# Patient Record
Sex: Male | Born: 1995 | Race: White | Hispanic: No | Marital: Single | Smoking: Never smoker
Health system: Southern US, Community
[De-identification: ages and names within clinical notes are randomized; demographics above are authoritative.]

## PROBLEM LIST (undated history)

## (undated) DIAGNOSIS — R51 Headache: Secondary | ICD-10-CM

## (undated) DIAGNOSIS — R569 Unspecified convulsions: Principal | ICD-10-CM

## (undated) HISTORY — DX: Unspecified convulsions: R56.9

## (undated) HISTORY — PX: WISDOM TOOTH EXTRACTION: SHX21

---
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
## 2014-05-04 ENCOUNTER — Telehealth: Payer: Self-pay | Admitting: Neurology

## 2014-05-04 DIAGNOSIS — R569 Unspecified convulsions: Secondary | ICD-10-CM

## 2014-05-04 NOTE — Telephone Encounter (Signed)
See phone note today.

## 2014-05-04 NOTE — Telephone Encounter (Signed)
I called the mother. The patient has had a single seizure event, lasting about a year ago. The patient did have MRI evaluation the brain, but he never came in for the EEG study. We will need to get EEG, and a revisit. If the EEG is normal, the patient may return to driving.  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

## 2014-05-04 NOTE — Telephone Encounter (Signed)
Patient's mother Bonita Quin calling to ask whether the doctor needs to do anything to lift patient's driving restrictions since it has already been 6 months, or if it will drop off by itself. Patient is trying to get his license. Patient's mother Bonita Quin is not in patient contacts. Please return call and advise.

## 2014-05-05 ENCOUNTER — Telehealth: Payer: Self-pay | Admitting: *Deleted

## 2014-05-05 NOTE — Telephone Encounter (Signed)
I called patient and scheduled EEG for 05/12/14. He will check with his mother to see if this appointment is ok. If not, patient will call back to reschedule. He also needs a FOLLOW REVISIT AFTER EEG.

## 2014-05-08 ENCOUNTER — Encounter: Payer: Self-pay | Admitting: Neurology

## 2014-05-08 ENCOUNTER — Ambulatory Visit (INDEPENDENT_AMBULATORY_CARE_PROVIDER_SITE_OTHER): Payer: Self-pay | Admitting: Neurology

## 2014-05-08 ENCOUNTER — Other Ambulatory Visit (INDEPENDENT_AMBULATORY_CARE_PROVIDER_SITE_OTHER): Payer: Self-pay

## 2014-05-08 ENCOUNTER — Telehealth: Payer: Self-pay | Admitting: *Deleted

## 2014-05-08 ENCOUNTER — Encounter (INDEPENDENT_AMBULATORY_CARE_PROVIDER_SITE_OTHER): Payer: Self-pay

## 2014-05-08 VITALS — BP 108/80 | HR 62 | Ht 67.0 in | Wt 163.0 lb

## 2014-05-08 DIAGNOSIS — R569 Unspecified convulsions: Secondary | ICD-10-CM

## 2014-05-08 DIAGNOSIS — Z0289 Encounter for other administrative examinations: Secondary | ICD-10-CM

## 2014-05-08 DIAGNOSIS — G40309 Generalized idiopathic epilepsy and epileptic syndromes, not intractable, without status epilepticus: Secondary | ICD-10-CM

## 2014-05-08 MED ORDER — LEVETIRACETAM 500 MG PO TABS: 500.0000 mg | ORAL_TABLET | Freq: Two times a day (BID) | ORAL | Status: AC

## 2014-05-08 NOTE — Procedures (Signed)
      Ronald Trevino is a 18 year old patient with a history of a single seizure event that occurred around 04/18/2013. The patient never followed up for MRI evaluation or for EEG evaluation, but he has not driven since the seizure. He comes in finally for evaluation for the seizure.  This is a routine EEG. No skull defect is noted. The patient is on no medications.  EEG classification: Dysrhythmia grade 2 left hemisphere  Description of the recording: The background rhythms of this recording consists of a relatively well modulated medium amplitude alpha rhythm of 10 Hz that is reactive to eye opening and closure. Photic stimulation was not performed, but hyperventilation was performed, and this results in a minimal buildup of the background rhythm activities without significant slowing seen. Intermittently during the recording, dysrhythmic theta activity is seen emanating from the left hemisphere. Occasionally, generalized dysrhythmic activity is seen, with bifrontal sharp and slow-wave activity. No electrographic seizures were recorded. Cardiac monitor shows a heart rate of 66 without evidence of cardiac arrhythmias.  Impression: This is an abnormal EEG recording secondary to dysrhythmic activity emanating from the left hemisphere suggesting a lowered seizure threshold with a left brain abnormality. No electrographic seizures were recorded.

## 2014-05-08 NOTE — Progress Notes (Signed)
Reason for visit: Seizure  Ronald Trevino is an 18 y.o. male  History of present illness:  Ronald Trevino is a 18 year old right-handed white male with a history of a single seizure event that occurred in the beginning of September 2014. The patient had been taking Ultram around that time. The patient was set up for MRI evaluation and an EEG study. He has refrained from driving. He did not get the MRI of the brain until December of 2014, and he just now got the EEG study today. The MRI of the brain was relatively unremarkable with exception of Arnold-Chiari malformation, and an EEG study is abnormal with dysrhythmic activity emanating from the left hemisphere, with occasional bursts of generalized slowing and bifrontal sharp and slow-wave activity. The patient is felt to have epilepsy, although he has gone an entire year without any recurrent seizures. The patient indicates that he has not had episodes of "lost time", he has not had any tongue biting or nocturia. The patient returns for an evaluation.  Past Medical History  Diagnosis Date  . Seizures   . Convulsion 05/12/2013  . [id number](657.8)     Past Surgical History  Procedure Laterality Date  . Wisdom tooth extraction    . Maxillary le forte i osteotomy Bilateral 11/08/2013    Procedure: MAXILLARY LEFORTE I;  Surgeon: Georgia Lopes, DDS;  Location: [facility] OR;  Service: Oral Surgery;  Laterality: Bilateral;  . Mandible osteotomy Bilateral 11/08/2013    Procedure: BILATERAL MANDIBULAR OSTEOTOMY/SAGGITAL SPLIT OSTEOTOMY WITH BONE GRAFT;  Surgeon: Georgia Lopes, DDS;  Location: [facility] OR;  Service: Oral Surgery;  Laterality: Bilateral;    Family History  Problem Relation Age of Onset  . Diabetes Other   . Cancer - Lung Other     Social history:  reports that he has never smoked. He has never used smokeless tobacco. He reports that he uses illicit drugs (Marijuana). He reports that he does not drink alcohol.    Allergies  Allergen Reactions    . Tramadol     UNKNOWN    Medications:  No current outpatient prescriptions on file prior to visit.   No current facility-administered medications on file prior to visit.    ROS:  Out of a complete 14 system review of symptoms, the patient complains only of the following symptoms, and all other reviewed systems are negative.  Seizure event  Blood pressure 108/80, pulse 62, height  (1.702 m), weight 163 lb (73.936 kg).  Physical Exam  General: The patient is alert and cooperative at the time of the examination.  Skin: No significant peripheral edema is noted.   Neurologic Exam  Mental status: The patient is oriented x 3.  Cranial nerves: Facial symmetry is present. Speech is normal, no aphasia or dysarthria is noted. Extraocular movements are full. Visual fields are full.  Motor: The patient has good strength in all 4 extremities.  Sensory examination: Soft touch sensation is symmetric on the face, arms, and legs.  Coordination: The patient has good finger-nose-finger and heel-to-shin bilaterally.  Gait and station: The patient has a normal gait. Tandem gait is normal. Romberg is negative. No drift is seen.  Reflexes: Deep tendon reflexes are symmetric.    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 History of seizure event  The patient had a single seizure event approximately one  year ago, but the EEG study is clearly abnormal with a left brain abnormality. The patient will be placed on Keppra at this point. He has already gone over a year without driving, it is probably okay for him to get back to driving at this time. The patient will start on Keppra taking 250 mg twice daily for 2 weeks, and then go to 500 mg twice daily. He will contact our office if there are tolerance issues. Otherwise, he will followup in 6  months.  Marlan Palau MD 05/08/2014 12:15 PM  [facility] [address] [place], [place] [address]  Phone [phone] Fax [phone]

## 2014-05-08 NOTE — Telephone Encounter (Signed)
I will write a letter for the [facility].

## 2014-05-08 NOTE — Patient Instructions (Addendum)
With the Keppra (levacitram) 500 mg tablet, start taking 1/2 tablet twice a day for 2 weeks, then take one tablet twice a day.  Epilepsy Epilepsy is a disorder in which a person has repeated seizures over time. A seizure is a release of abnormal electrical activity in the brain. Seizures can cause a change in attention, behavior, or the ability to remain awake and alert (altered mental status). Seizures often involve uncontrollable shaking (convulsions).  Most people with epilepsy lead normal lives. However, people with epilepsy are at an increased risk of falls, accidents, and injuries. Therefore, it is important to begin treatment right away. CAUSES  Epilepsy has many possible causes. Anything that disturbs the normal pattern of brain cell activity can lead to seizures. This may include:   Head injury.  Birth trauma.  High fever as a child.  Stroke.  Bleeding into or around the brain.  Certain drugs.  Prolonged low oxygen, such as what occurs after CPR efforts.  Abnormal brain development.  Certain illnesses, such as meningitis, encephalitis (brain infection), malaria, and other infections.  An imbalance of nerve signaling chemicals (neurotransmitters).  SIGNS AND SYMPTOMS  The symptoms of a seizure can vary greatly from one person to another. Right before a seizure, you may have a warning (aura) that a seizure is about to occur. An aura may include the following symptoms:  Fear or anxiety.  Nausea.  Feeling like the room is spinning (vertigo).  Vision changes, such as seeing flashing lights or spots. Common symptoms during a seizure include:  Abnormal sensations, such as an abnormal smell or a bitter taste in the mouth.   Sudden, general body stiffness.   Convulsions that involve rhythmic jerking of the face, arm, or leg on one or both sides.   Sudden change in consciousness.   Appearing to be awake but not responding.   Appearing to be asleep but cannot be  awakened.   Grimacing, chewing, lip smacking, drooling, tongue biting, or loss of bowel or bladder control. After a seizure, you may feel sleepy for a while. DIAGNOSIS  Your health care provider will ask about your symptoms and take a medical history. Descriptions from any witnesses to your seizures will be very helpful in the diagnosis. A physical exam, including a detailed neurological exam, is necessary. Various tests may be done, such as:   An electroencephalogram (EEG). This is a painless test of your brain waves. In this test, a diagram is created of your brain waves. These diagrams can be interpreted by a specialist.  An MRI of the brain.   A CT scan of the brain.   A spinal tap (lumbar puncture, LP).  Blood tests to check for signs of infection or abnormal blood chemistry. TREATMENT  There is no cure for epilepsy, but it is generally treatable. Once epilepsy is diagnosed, it is important to begin treatment as soon as possible. For most people with epilepsy, seizures can be controlled with medicines. The following may also be used:  A pacemaker for the brain (vagus nerve stimulator) can be used for people with seizures that are not well controlled by medicine.  Surgery on the brain. For some people, epilepsy eventually goes away. HOME CARE INSTRUCTIONS   Follow your health care provider's recommendations on driving and safety in normal activities.  Get enough rest. Lack of sleep can cause seizures.  Only take over-the-counter or prescription medicines as directed by your health care provider. Take any prescribed medicine exactly as directed.  Avoid  any known triggers of your seizures.  Keep a seizure diary. Record what you recall about any seizure, especially any possible trigger.   Make sure the people you live and work with know that you are prone to seizures. They should receive instructions on how to help you. In general, a witness to a seizure should:   Cushion  your head and body.   Turn you on your side.   Avoid unnecessarily restraining you.   Not place anything inside your mouth.   Call for emergency medical help if there is any question about what has occurred.   Follow up with your health care provider as directed. You may need regular blood tests to monitor the levels of your medicine.  SEEK MEDICAL CARE IF:   You develop signs of infection or other illness. This might increase the risk of a seizure.   You seem to be having more frequent seizures.   Your seizure pattern is changing.  SEEK IMMEDIATE MEDICAL CARE IF:   You have a seizure that does not stop after a few moments.   You have a seizure that causes any difficulty in breathing.   You have a seizure that results in a very severe headache.   You have a seizure that leaves you with the inability to speak or use a part of your body.  Document Released: 08/04/2005 Document Revised: 05/25/2013 Document Reviewed: 03/16/2013 [facility] Patient Information 2015 [place], [place]. This information is not intended to replace advice given to you by your health care provider. Make sure you discuss any questions you have with your health care provider.

## 2014-05-08 NOTE — Telephone Encounter (Signed)
Please write letter for dmv stating driving restrictions.

## 2014-05-09 ENCOUNTER — Telehealth: Payer: Self-pay | Admitting: *Deleted

## 2014-05-09 NOTE — Telephone Encounter (Signed)
Informed patient father that a letter has been written for [facility]. He will come by the office to pick-up.

## 2014-05-11 ENCOUNTER — Telehealth: Payer: Self-pay | Admitting: Neurology

## 2014-05-11 NOTE — Telephone Encounter (Signed)
Spoke with patient's mother who will drop off dmv paperwork for completion. She was made aware of $25 fee. She says that there is a correction that needs to be made as far as EEG completion. EEG testing was done on 05/10/14.

## 2014-05-11 NOTE — Telephone Encounter (Signed)
Patient's mother calling to state that the letter that was written for the [facility] was not enough for patient's driving restrictions to be lifted, please return call and advise.

## 2014-05-12 ENCOUNTER — Telehealth: Payer: Self-pay | Admitting: *Deleted

## 2014-05-12 ENCOUNTER — Other Ambulatory Visit: Payer: Self-pay

## 2014-05-12 NOTE — Telephone Encounter (Signed)
Form DMV to nurse 05-12-14 to be completed.

## 2014-05-16 DIAGNOSIS — Z0289 Encounter for other administrative examinations: Secondary | ICD-10-CM

## 2014-05-22 NOTE — Telephone Encounter (Signed)
I received Forms from Medical Records and I have placed them in the Providers file and have placed them on his assistants In-Box to be completed. 

## 2014-05-23 ENCOUNTER — Telehealth: Payer: Self-pay | Admitting: Neurology

## 2014-05-23 NOTE — Telephone Encounter (Addendum)
The forms [facility] confirmation received.  Also spoke to patient and relayed forms were completed [facility]

## 2014-05-23 NOTE — Telephone Encounter (Signed)
Patient's father checking status of DMV paperwork.  Please call and advise.

## 2014-05-23 NOTE — Telephone Encounter (Signed)
These forms were completed on 05-17-14, but somehow ended back in a folder to be completed.

## 2014-05-23 NOTE — Telephone Encounter (Signed)
I called patient. The DMV forms were received, probably around 05/12/2014. I am not sure who has the forms currently but they need to be filled out ASAP.

## 2014-05-23 NOTE — Telephone Encounter (Signed)
Patient calling checking on his DMV forms, upset stating that it should not take this long to complete his forms.

## 2014-05-29 NOTE — Telephone Encounter (Signed)
Form was checked off and taken to MR

## 2014-11-06 ENCOUNTER — Ambulatory Visit: Payer: Self-pay | Admitting: Adult Health

## 2017-09-30 ENCOUNTER — Encounter (HOSPITAL_COMMUNITY): Payer: Self-pay | Admitting: Emergency Medicine

## 2017-09-30 ENCOUNTER — Observation Stay (HOSPITAL_COMMUNITY)
Admission: EM | Admit: 2017-09-30 | Discharge: 2017-10-02 | Disposition: A | Discharge status: Home / Self Care | Payer: PRIVATE HEALTH INSURANCE | Attending: General Surgery | Admitting: General Surgery

## 2017-09-30 ENCOUNTER — Other Ambulatory Visit: Payer: Self-pay

## 2017-09-30 ENCOUNTER — Emergency Department (HOSPITAL_COMMUNITY): Payer: PRIVATE HEALTH INSURANCE

## 2017-09-30 DIAGNOSIS — S41112A Laceration without foreign body of left upper arm, initial encounter: Secondary | ICD-10-CM | POA: Insufficient documentation

## 2017-09-30 DIAGNOSIS — S32039A Unspecified fracture of third lumbar vertebra, initial encounter for closed fracture: Secondary | ICD-10-CM | POA: Diagnosis not present

## 2017-09-30 DIAGNOSIS — T1490XA Injury, unspecified, initial encounter: Secondary | ICD-10-CM | POA: Diagnosis present

## 2017-09-30 DIAGNOSIS — M79672 Pain in left foot: Secondary | ICD-10-CM

## 2017-09-30 DIAGNOSIS — S060X0A Concussion without loss of consciousness, initial encounter: Principal | ICD-10-CM | POA: Insufficient documentation

## 2017-09-30 DIAGNOSIS — S32019A Unspecified fracture of first lumbar vertebra, initial encounter for closed fracture: Secondary | ICD-10-CM | POA: Diagnosis not present

## 2017-09-30 DIAGNOSIS — S27339A Laceration of lung, unspecified, initial encounter: Secondary | ICD-10-CM | POA: Diagnosis present

## 2017-09-30 DIAGNOSIS — Y92488 Other paved roadways as the place of occurrence of the external cause: Secondary | ICD-10-CM | POA: Diagnosis not present

## 2017-09-30 DIAGNOSIS — S32029A Unspecified fracture of second lumbar vertebra, initial encounter for closed fracture: Secondary | ICD-10-CM | POA: Diagnosis not present

## 2017-09-30 DIAGNOSIS — S32009A Unspecified fracture of unspecified lumbar vertebra, initial encounter for closed fracture: Secondary | ICD-10-CM | POA: Diagnosis present

## 2017-09-30 LAB — COMPREHENSIVE METABOLIC PANEL
ALT: 41 U/L (ref 17–63)
AST: 69 U/L — ABNORMAL HIGH (ref 15–41)
Albumin: 4.2 g/dL (ref 3.5–5.0)
Alkaline Phosphatase: 58 U/L (ref 38–126)
Anion gap: 11 (ref 5–15)
BUN: 8 mg/dL (ref 6–20)
CHLORIDE: 102 mmol/L (ref 101–111)
CO2: 23 mmol/L (ref 22–32)
CREATININE: 1.05 mg/dL (ref 0.61–1.24)
Calcium: 9.2 mg/dL (ref 8.9–10.3)
GFR calc non Af Amer: >60 mL/min (ref >60)
Glucose, Bld: 111 mg/dL — ABNORMAL HIGH (ref 65–99)
Potassium: 3.6 mmol/L (ref 3.5–5.1)
Sodium: 136 mmol/L (ref 135–145)
Total Bilirubin: 0.8 mg/dL (ref 0.3–1.2)
Total Protein: 6.7 g/dL (ref 6.5–8.1)

## 2017-09-30 LAB — CBC
HCT: 43.2 % (ref 39.0–52.0)
Hemoglobin: 14.5 g/dL (ref 13.0–17.0)
MCH: 28.2 pg (ref 26.0–34.0)
MCHC: 33.6 g/dL (ref 30.0–36.0)
MCV: 84 fL (ref 78.0–100.0)
Platelets: 269 10*3/uL (ref 150–400)
RBC: 5.14 MIL/uL (ref 4.22–5.81)
RDW: 12.2 % (ref 11.5–15.5)
WBC: 27.3 10*3/uL — ABNORMAL HIGH (ref 4.0–10.5)

## 2017-09-30 MED ORDER — METOPROLOL TARTRATE 5 MG/5ML IV SOLN: 5.0000 mg | Freq: Four times a day (QID) | INTRAVENOUS | Status: DC | PRN

## 2017-09-30 MED ORDER — ACETAMINOPHEN 325 MG PO TABS
650.0000 mg | ORAL_TABLET | ORAL | Status: DC | PRN
  Administered 2017-10-01: 650 mg via ORAL
  Filled 2017-09-30: qty 2

## 2017-09-30 MED ORDER — BISACODYL 10 MG RE SUPP: 10.0000 mg | Freq: Every day | RECTAL | Status: DC | PRN

## 2017-09-30 MED ORDER — HYDROMORPHONE HCL 1 MG/ML IJ SOLN
1.0000 mg | INTRAMUSCULAR | Status: DC | PRN
  Administered 2017-09-30: 1 mg via INTRAVENOUS
  Filled 2017-09-30 (×2): qty 1

## 2017-09-30 MED ORDER — ONDANSETRON HCL 4 MG/2ML IJ SOLN
4.0000 mg | Freq: Four times a day (QID) | INTRAMUSCULAR | Status: DC | PRN
  Administered 2017-10-01 – 2017-10-02 (×2): 4 mg via INTRAVENOUS
  Filled 2017-09-30 (×2): qty 2

## 2017-09-30 MED ORDER — ENOXAPARIN SODIUM 40 MG/0.4ML ~~LOC~~ SOLN
40.0000 mg | Freq: Every day | SUBCUTANEOUS | Status: DC
  Administered 2017-10-01: 40 mg via SUBCUTANEOUS
  Filled 2017-09-30: qty 0.4

## 2017-09-30 MED ORDER — HYDROMORPHONE HCL 1 MG/ML IJ SOLN
0.5000 mg | INTRAMUSCULAR | Status: AC | PRN
  Administered 2017-10-01: 1 mg via INTRAVENOUS
  Filled 2017-09-30: qty 2
  Filled 2017-09-30: qty 1

## 2017-09-30 MED ORDER — IOPAMIDOL (ISOVUE-300) INJECTION 61%
INTRAVENOUS | Status: AC
  Administered 2017-09-30: 100 mL
  Filled 2017-09-30: qty 100

## 2017-09-30 MED ORDER — OXYCODONE HCL 5 MG PO TABS: 5.0000 mg | ORAL_TABLET | ORAL | Status: DC | PRN

## 2017-09-30 MED ORDER — DOUBLE ANTIBIOTIC 500-10000 UNIT/GM EX OINT
TOPICAL_OINTMENT | Freq: Two times a day (BID) | CUTANEOUS | Status: DC
  Administered 2017-10-01 – 2017-10-02 (×4): via TOPICAL
  Filled 2017-09-30 (×2): qty 28.4

## 2017-09-30 MED ORDER — OXYCODONE HCL 5 MG PO TABS
10.0000 mg | ORAL_TABLET | ORAL | Status: DC | PRN
  Administered 2017-10-01 – 2017-10-02 (×8): 10 mg via ORAL
  Filled 2017-09-30 (×8): qty 2

## 2017-09-30 MED ORDER — DOCUSATE SODIUM 100 MG PO CAPS
100.0000 mg | ORAL_CAPSULE | Freq: Two times a day (BID) | ORAL | Status: DC
  Administered 2017-10-01 – 2017-10-02 (×3): 100 mg via ORAL
  Filled 2017-09-30 (×3): qty 1

## 2017-09-30 MED ORDER — ONDANSETRON 4 MG PO TBDP: 4.0000 mg | ORAL_TABLET | Freq: Four times a day (QID) | ORAL | Status: DC | PRN
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
## 2017-09-30 NOTE — ED Notes (Signed)
Dr Donell Beersbyerly here to see the pt

## 2017-09-30 NOTE — ED Notes (Signed)
Pt whining  He wants all the stuff off him  He wants something to eat and drink

## 2017-09-30 NOTE — ED Notes (Signed)
Report called to rn nellie on 6n
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
## 2017-09-30 NOTE — ED Notes (Signed)
Just woke up from sleeping from getting pain med  Moaning now with both eyes closed.  Wanting to walk in the room
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
## 2017-10-01 ENCOUNTER — Observation Stay (HOSPITAL_COMMUNITY): Payer: PRIVATE HEALTH INSURANCE

## 2017-10-01 LAB — BASIC METABOLIC PANEL
ANION GAP: 12 (ref 5–15)
BUN: 9 mg/dL (ref 6–20)
CALCIUM: 9 mg/dL (ref 8.9–10.3)
CO2: 23 mmol/L (ref 22–32)
CREATININE: 0.91 mg/dL (ref 0.61–1.24)
Chloride: 102 mmol/L (ref 101–111)
GFR calc Af Amer: >60 mL/min (ref >60)
GFR calc non Af Amer: >60 mL/min (ref >60)
GLUCOSE: 113 mg/dL — AB (ref 65–99)
Potassium: 3.5 mmol/L (ref 3.5–5.1)
Sodium: 137 mmol/L (ref 135–145)

## 2017-10-01 LAB — CBC
HEMATOCRIT: 42.4 % (ref 39.0–52.0)
Hemoglobin: 14 g/dL (ref 13.0–17.0)
MCH: 28 pg (ref 26.0–34.0)
MCHC: 33 g/dL (ref 30.0–36.0)
MCV: 84.8 fL (ref 78.0–100.0)
PLATELETS: 236 10*3/uL (ref 150–400)
RBC: 5 MIL/uL (ref 4.22–5.81)
RDW: 12.5 % (ref 11.5–15.5)
WBC: 11 10*3/uL — ABNORMAL HIGH (ref 4.0–10.5)

## 2017-10-01 LAB — HIV ANTIBODY (ROUTINE TESTING W REFLEX): HIV Screen 4th Generation wRfx: NONREACTIVE

## 2017-10-01 MED ORDER — ACETAMINOPHEN 500 MG PO TABS
1000.0000 mg | ORAL_TABLET | Freq: Three times a day (TID) | ORAL | Status: DC
  Administered 2017-10-01 – 2017-10-02 (×3): 1000 mg via ORAL
  Filled 2017-10-01 (×3): qty 2

## 2017-10-01 MED ORDER — METHOCARBAMOL 750 MG PO TABS
750.0000 mg | ORAL_TABLET | Freq: Three times a day (TID) | ORAL | Status: DC
  Administered 2017-10-01 – 2017-10-02 (×3): 750 mg via ORAL
  Filled 2017-10-01 (×3): qty 1

## 2017-10-01 MED ORDER — POLYETHYLENE GLYCOL 3350 17 G PO PACK
17.0000 g | PACK | Freq: Every day | ORAL | Status: DC
  Administered 2017-10-02: 17 g via ORAL
  Filled 2017-10-01: qty 1
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
## 2017-10-01 NOTE — Progress Notes (Signed)
Received pt per stretcher. Pt is alert and oriented x4. Pt grimaces with back pain upon movement. Denies SOB. Will monitor pt.
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
## 2017-10-01 NOTE — Progress Notes (Signed)
Pt with nausea and vomitted small clear emesis. Zofran given. Vitals within normal limits.
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
## 2017-10-02 MED ORDER — METHOCARBAMOL 750 MG PO TABS: 750.0000 mg | ORAL_TABLET | Freq: Three times a day (TID) | ORAL | 1 refills | Status: AC

## 2017-10-02 MED ORDER — OXYCODONE HCL 5 MG PO TABS: 5.0000 mg | ORAL_TABLET | ORAL | 0 refills | Status: AC | PRN

## 2017-10-02 NOTE — Progress Notes (Signed)
Pt is anxious to go home, girlfriend at bedside. Ambulating in the room. Back pain is controlled w/ oral narcotics. Discharged to home, discharge instructions given to pt, verbalized understanding.
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
## 2017-10-02 NOTE — Discharge Instructions (Signed)
Concussion, Adult  A concussion is a brain injury from a direct hit (blow) to the head or body. This injury causes the brain to shake quickly back and forth inside the skull. It is caused by:   A hit to the head.   A quick and sudden movement (jolt) of the head or neck.    How fast you will get better from a concussion depends on many things like how bad your concussion was, what part of your brain was hurt, how old you are, and how healthy you were before the concussion. Recovery can take time. It is important to wait to return to activity until a doctor says it is safe and your symptoms are all gone.  Follow these instructions at home:  Activity   Limit activities that need a lot of thought or concentration. These include:  ? Homework or work for your job.  ? Watching TV.  ? Computer work.  ? Playing memory games and puzzles.   Rest. Rest helps the brain to heal. Make sure you:  ? Get plenty of sleep at night. Do not stay up late.  ? Go to bed at the same time every day.  ? Rest during the day. Take naps or rest breaks when you feel tired.   It can be dangerous if you get another concussion before the first one has healed Do not do activities that could cause a second concussion, such as riding a bike or playing sports.   Ask your doctor when you can return to your normal activities, like driving, riding a bike, or using machinery. Your ability to react may be slower. Do not do these activities if you are dizzy. Your doctor will likely give you a plan for slowly going back to activities.  General instructions   Take over-the-counter and prescription medicines only as told by your doctor.   Do not drink alcohol until your doctor says you can.   If it is harder than usual to remember things, write them down.   If you are easily distracted, try to do one thing at a time. For example, do not try to watch TV while making dinner.   Talk with family members or close friends when you need to make important  decisions.   Watch your symptoms and tell other people to do the same. Other problems (complications) can happen after a concussion. Older adults with a brain injury may have a higher risk of serious problems, such as a blood clot in the brain.   Tell your teachers, school nurse, school counselor, coach, athletic trainer, or work manager about your injury and symptoms. Tell them about what you can or cannot do. They should watch for:  ? More problems with attention or concentration.  ? More trouble remembering or learning new information.  ? More time needed to do tasks or assignments.  ? Being more annoyed (irritable) or having a harder time dealing with stress.  ? Any other symptoms that get worse.   Keep all follow-up visits as told by your health care provider. This is important.  Prevention   It is very important that you donot get another brain injury, especially before you have healed. In rare cases, another injury can cause permanent brain damage, brain swelling, or death. You have the most risk if you get another head injury in the first 7-10 days after you were hurt before. To avoid injuries:  ? Wear a seat belt when you ride in   Skiing.  Skateboarding.  Skating. ? Make your home safe by:  Removing things from the floor or stairs that could make you trip.  Using grab bars in bathrooms and handrails by stairs.  Placing non-slip mats on floors and in bathtubs.  Putting more light in dark areas. Contact a doctor if:  Your symptoms get worse.  You have new symptoms.  You keep having symptoms for more than 2 weeks. Get help right away if:  You have bad headaches, or your headaches get worse.  You have weakness in any part of your body.  You have loss of feeling (numbness).  You feel off  balance.  You keep throwing up (vomiting).  You feel more sleepy.  The black center of one eye (pupil) is bigger than the other one.  You twitch or shake violently (convulse) or have a seizure.  Your speech is not clear (is slurred).  You feel more tired, more confused, or more annoyed.  You do not recognize people or places.  You have neck pain.  It is hard to wake you up.  You have strange behavior changes.  You pass out (lose consciousness). Summary  A concussion is a brain injury from a direct hit (blow) to the head or body.  This condition is treated with rest and careful watching of symptoms.  If you keep having symptoms for more than 2 weeks, call your doctor. This information is not intended to replace advice given to you by your health care provider. Make sure you discuss any questions you have with your health care provider. Document Released: 07/23/2009 Document Revised: 07/19/2016 Document Reviewed: 07/19/2016 Elsevier Interactive Patient Education  2017 [employer].   1. PAIN CONTROL:  1. Pain is best controlled by a usual combination of three different methods TOGETHER:  1. Ice/Heat 2. Over the counter pain medication 3. Prescription pain medication 2. Most patients will experience some swelling and bruising around wounds. Ice packs or heating pads (30-60 minutes up to 6 times a day) will help. Use ice for the first few days to help decrease swelling and bruising, then switch to heat to help relax tight/sore spots and speed recovery. Some people prefer to use ice alone, heat alone, alternating between ice & heat. Experiment to what works for you. Swelling and bruising can take several weeks to resolve.  3. It is helpful to take an over-the-counter pain medication regularly for the first few weeks. Choose one of the following that works best for you:  1. Naproxen (Aleve, etc) Two 220mg  tabs twice a day 2. Ibuprofen (Advil, etc) Three 200mg  tabs four times a  day (every meal & bedtime) 3. Acetaminophen (Tylenol, etc) 500-650mg  four times a day (every meal & bedtime) 4. A prescription for pain medication (such as oxycodone, hydrocodone, etc) should be given to you upon discharge. Take your pain medication as prescribed.  1. If you are having problems/concerns with the prescription medicine (does not control pain, nausea, vomiting, rash, itching, etc), please call us [phone] to see if we need to switch you to a different pain medicine that will work better for you and/or control your side effect better. 2. If you need a refill on your pain medication, please contact your pharmacy. They will contact our office to request authorization. Prescriptions will not be filled after 5 pm or on week-ends. 4. Avoid getting constipated. When taking pain medications, it is common to experience some constipation. Increasing fluid intake and taking a fiber supplement (such  as Metamucil, Citrucel, FiberCon, MiraLax, etc) 1-2 times a day regularly will usually help prevent this problem from occurring. A mild laxative (prune juice, Milk of Magnesia, MiraLax, etc) should be taken according to package directions if there are no bowel movements after 48 hours.  5. Watch out for diarrhea. If you have many loose bowel movements, simplify your diet to bland foods & liquids for a few days. Stop any stool softeners and decrease your fiber supplement. Switching to mild anti-diarrheal medications (Kayopectate, Pepto Bismol) can help. If this worsens or does not improve, please call us. 6. Wash / shower every day. You may shower daily and replace your bandges after showering. No bathing or submerging your wounds in water until they heal. 7. FOLLOW UP in our office  1. Please call go to your appointment on 02/26 at 9:30AM  WHEN TO CALL US [phone]:  1. Poor pain control 2. Reactions / problems with new medications (rash/itching, nausea, etc)  3. Fever over 101.5 F (38.5  C) 4. Worsening swelling or bruising 5. Continued bleeding from wounds. 6. Increased pain, redness, or drainage from the wounds which could be signs of infection  The clinic staff is available to answer your questions during regular business hours (8:30am-5pm). Please dont hesitate to call and ask to speak to one of our nurses for clinical concerns.  If you have a medical emergency, go to the nearest emergency room or call 911.  A surgeon from [facility] is always on call at the [facility]   [facility] [place]  [address], [address], [place], [place] [address] ?  MAIN: [phone] ? TOLL FREE: [phone] ?  FAX [phone]  [email or web address]

## 2017-10-05 ENCOUNTER — Telehealth (HOSPITAL_COMMUNITY): Payer: Self-pay

## 2017-10-05 NOTE — Telephone Encounter (Signed)
Returned call to Ronald Trevino and informed him that I can not refill any narcotic pain medication without evaluating him. Offered patient appointment in trauma clinic tomorrow 2/19 instead of original appointment on 2/26. Patient states that he does not think that he'll be able to get to the office on 2/19 and would rather come in for follow up 2/26. He is taking 200 mg ibuprofen as needed in addition to oxycodone. Discussed with patient that he can increase does of ibuprofen to 600 mg and to take with food, told patient not to exceed 2,400 mg of ibuprofen in a 24 h period. Told patient that he may also add tylenol to assist with pain control. Discussed mobilization. Patient voiced understanding and agreement with plan.  Wells GuilesKelly Rayburn , [facility] 10/05/2017, 10:23 AM Pager: [phone] Mon-Fri 7:00 am-4:30 pm Sat-Sun 7:00 am-11:30 am

## 2017-10-05 NOTE — Telephone Encounter (Signed)
Mr. Ronald Trevino called our office wanting another Rx for pain. His phone number is [phone]. The pharmacy where the Rx was originally filled [facility] [phone].

## 2017-10-28 ENCOUNTER — Encounter (HOSPITAL_COMMUNITY): Payer: Self-pay

## 2017-10-28 ENCOUNTER — Ambulatory Visit (HOSPITAL_COMMUNITY): Payer: PRIVATE HEALTH INSURANCE | Attending: General Surgery

## 2017-10-28 DIAGNOSIS — M6281 Muscle weakness (generalized): Secondary | ICD-10-CM | POA: Insufficient documentation

## 2017-10-28 DIAGNOSIS — R293 Abnormal posture: Secondary | ICD-10-CM | POA: Diagnosis present

## 2017-10-28 DIAGNOSIS — M545 Low back pain, unspecified: Secondary | ICD-10-CM
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
## 2017-10-29 ENCOUNTER — Encounter (HOSPITAL_COMMUNITY): Payer: Self-pay

## 2017-10-29 ENCOUNTER — Ambulatory Visit (HOSPITAL_COMMUNITY): Payer: PRIVATE HEALTH INSURANCE | Admitting: Physical Therapy

## 2017-10-29 NOTE — Therapy (Signed)
Pt arrived for session with work return and financial concerns.  Spoke with evaluating therapist and patient is to work on what was instructed at evaluation and return next week to ensure safe lifting form prior to returning to work.  Pt verbalized understanding.  No charge for today's visit.   Lurena NidaAmy B Frazier, PTA/CLT [phone]

## 2017-10-30 ENCOUNTER — Telehealth (HOSPITAL_COMMUNITY): Payer: Self-pay

## 2017-10-30 NOTE — Telephone Encounter (Signed)
PT called pt's work, [name], regarding any specific RTW paperwork. Representative on the phone stated that HR requires a medical clearance note from his physician stating that he is cleared to return to work. PT has attempted to contact pt's referring provider, Mattie MarlinJessica Focht, PA-C, and is awaiting callback regarding this.   Ronald CanavanBrooke Kasidee Trevino PT, DPT

## 2017-11-02 ENCOUNTER — Telehealth (HOSPITAL_COMMUNITY): Payer: Self-pay

## 2017-11-02 NOTE — Telephone Encounter (Signed)
[employer] Employer ZO:[id number]:Steven Trey SailorsGrice Called and requested medical to see if FMLA can be continued for this patient missing work. Please have pt sign Medical Release and Fax records of Medical Necessity to [phone]. NF 11/02/17

## 2017-11-03 ENCOUNTER — Ambulatory Visit (HOSPITAL_COMMUNITY): Payer: PRIVATE HEALTH INSURANCE

## 2017-11-03 ENCOUNTER — Telehealth (HOSPITAL_COMMUNITY): Payer: Self-pay

## 2017-11-03 NOTE — Telephone Encounter (Signed)
No Show #1: I called the patient to discuss his missed appointment today which was scheduled for 4:45 PM. He stated that he spoke with Nehemiah SettleBrooke and she had instructed him to come into therapy this Thursday at 9:00 AM and that she would cancel his other appointments. I stated I would confirm that all other appointments would be cancelled and that we would plan to see him Thursday at 9.  Valentino Saxonachel Quinn-Brown, PT, DPT Physical Therapist with [facility]  11/03/2017 5:22 PM

## 2017-11-05 ENCOUNTER — Encounter (HOSPITAL_COMMUNITY): Payer: Self-pay

## 2017-11-05 ENCOUNTER — Ambulatory Visit (HOSPITAL_COMMUNITY): Payer: PRIVATE HEALTH INSURANCE

## 2017-11-05 DIAGNOSIS — R293 Abnormal posture: Secondary | ICD-10-CM

## 2017-11-05 DIAGNOSIS — M545 Low back pain, unspecified: Secondary | ICD-10-CM

## 2017-11-05 DIAGNOSIS — M6281 Muscle weakness (generalized): Secondary | ICD-10-CM

## 2017-11-05 NOTE — Therapy (Addendum)
[place], [place], [address] Phone: [phone]   Fax:  [phone]  Physical Therapy Treatment/Discharge Summary  Patient Details  Name: Ronald Trevino MRN: [id number] Date of Birth: [birth year] Referring Provider: Jackson Latino, PA-C   Encounter Date: 11/05/2017  PT End of Session - 11/05/17 0929    Visit Number  2    Number of Visits  9    Date for PT Re-Evaluation  11/25/17    Authorization Type  [place] [facility] [phone])    Authorization Time Period  10/28/17 to 11/25/17    PT Start Time  0900    PT Stop Time  0912    PT Time Calculation (min)  12 min    Activity Tolerance  Patient tolerated treatment well;No increased pain    Behavior During Therapy  WFL for tasks assessed/performed       Past Medical History:  Diagnosis Date  . Convulsion ([place]) 05/12/2013  . Headache(784.0)   . Seizures ([place])     Past Surgical History:  Procedure Laterality Date  . MANDIBLE OSTEOTOMY Bilateral 11/08/2013   Procedure: BILATERAL MANDIBULAR OSTEOTOMY/SAGGITAL SPLIT OSTEOTOMY WITH BONE GRAFT;  Surgeon: Gae Bon, DDS;  Location: [facility];  Service: Oral Surgery;  Laterality: Bilateral;  . MAXILLARY LE FORTE I OSTEOTOMY Bilateral 11/08/2013   Procedure: MAXILLARY LEFORTE I;  Surgeon: Gae Bon, DDS;  Location: [facility];  Service: Oral Surgery;  Laterality: Bilateral;  . WISDOM TOOTH EXTRACTION      There were no vitals filed for this visit.  Subjective Assessment - 11/05/17 0901    Subjective  Pt denies any pain this morning. he states that his back is steadily getting better.    Limitations  Lifting    How long can you sit comfortably?  20 mins    How long can you stand comfortably?  no issues    How long can you walk comfortably?  for a while, no real issues if he's not going up steep hills.    Patient Stated Goals  get better    Currently in Pain?  No/denies         [facility] PT Assessment - 11/05/17 0001       Strength   Right Hip Extension  5/5 was 4+    Right Hip ABduction  5/5 was 4+    Left Hip Extension  4+/5 was 4    Left Hip ABduction  4+/5 was 4      Flexibility   Soft Tissue Assessment /Muscle Length  yes    Hamstrings  L: 147deg R: 150 deg      Balance   Balance Assessed  Yes      Static Standing Balance   Static Standing - Balance Support  No upper extremity supported    Static Standing Balance -  Activities   Single Leg Stance - Right Leg;Single Leg Stance - Left Leg    Static Standing - Comment/# of Minutes  R: 30 sec L: 18 sec           PT Education - 11/05/17 0933    Education provided  Yes    Education Details  discharge plans, lifting technique; provided pt with medical clearance for retrun to work letter from [employer] (referring physician)    Person(s) Educated  Patient;Spouse    Methods  Explanation;Demonstration    Comprehension  Verbalized understanding;Returned demonstration       PT Short Term Goals -  11/05/17 0901      PT SHORT TERM GOAL #1   Title  Pt will be independent with HEP and perform consistently in order to decrease pain and maximzie return to PLOF.    Time  2    Period  Weeks    Status  Achieved      PT SHORT TERM GOAL #2   Title  Pt will have improved bil HS length to 150deg in 90/90 position in order to demo improved flexibility and decrease overall LBP.    Baseline  3/21: R: 150deg, L: 148deg    Time  2    Period  Weeks    Status  Partially Met        PT Long Term Goals - 11/05/17 0902      PT LONG TERM GOAL #1   Title  Pt will be able to perform bil SLS for at least 30 sec or > on BLE to demo improved functional strength of bil hips.     Baseline  3/21: R: 30 sec, L: 18 sec    Time  4    Period  Weeks    Status  Partially Met      PT LONG TERM GOAL #2   Title  Pt will be 5/5 of proximal hip musculature in order to maximize pt's ability to properly lift/squat in order to maximize function at work and decrease risk  for injury to lower back.     Baseline  3/21: hip ext 5/5, hip abd 4+/5    Time  4    Period  Weeks    Status  Partially Met      PT LONG TERM GOAL #3   Title  Pt will be able to lift 60# from floor to chest with proper lifting mechanics and no LBP to demo improved functional strength and maximize function at work while decreasing risk for injury to his low back.    Time  4    Period  Weeks    Status  Achieved            Plan - 11/05/17 0929    Clinical Impression Statement  PT reassessed pt's goals and outcome measures this date due to him reporting that his insurance doesn't cover PT and he needs to return to work as soon as possible. Pt's hip strength was 4+ to 5/5 today, his HS length has improved to 150deg on the R and 148deg on the L, and his lifting has significantly improved since his evaluation. He was able to demonstrate proper lifting technique of 60#, which is what he lifts at work on a daily basis, without any back pain. At this time, pt is not in need of skilled PT services and will be discharged to his HEP. PT provided pt with education on how to stabilize lower back when lifting and reiterated the importance of proper lifting technique so as to no reinjure his back.    Rehab Potential  Good    PT Frequency  2x / week    PT Duration  4 weeks    PT Treatment/Interventions  ADLs/Self Care Home Management;Cryotherapy;Electrical Stimulation;Moist Heat;Ultrasound;Gait training;Stair training;Functional mobility training;Therapeutic activities;Therapeutic exercise;Balance training;Neuromuscular re-education;Patient/family education;Manual techniques;Passive range of motion;Dry needling;Energy conservation;Taping    PT Next Visit Plan  discharged    PT Home Exercise Plan  eval: supine HS stretch     Consulted and Agree with Plan of Care  Patient;Family member/caregiver    Family Member Consulted  girlfriend       Patient will benefit from skilled therapeutic intervention in  order to improve the following deficits and impairments:  Decreased balance, Decreased strength, Hypomobility, Increased fascial restricitons, Increased muscle spasms, Impaired flexibility, Improper body mechanics, Postural dysfunction, Pain  Visit Diagnosis: Acute left-sided low back pain without sciatica  Abnormal posture  Muscle weakness (generalized)     Problem List Patient Active Problem List   Diagnosis Date Noted  . Lumbar transverse process fracture ([place]) 09/30/2017  . Post-operative state 11/08/2013  . Convulsion ([place]) 05/12/2013       PHYSICAL THERAPY DISCHARGE SUMMARY  Visits from Start of Care: 2  Current functional level related to goals / functional outcomes: See above   Remaining deficits: See above   Education / Equipment: Proper lifting technqiue Plan: Patient agrees to discharge.  Patient goals were partially met. Patient is being discharged due to meeting the stated rehab goals.  ?????      Geraldine Solar PT, [place] [address] [place], [place], [address] Phone: [phone]   Fax:  [phone]  Name: Ronald Trevino MRN: [id number] Date of Birth: 06-May-1996

## 2017-11-10 ENCOUNTER — Ambulatory Visit (HOSPITAL_COMMUNITY): Payer: PRIVATE HEALTH INSURANCE

## 2017-11-12 ENCOUNTER — Encounter (HOSPITAL_COMMUNITY): Payer: PRIVATE HEALTH INSURANCE

## 2017-11-17 ENCOUNTER — Encounter (HOSPITAL_COMMUNITY): Payer: PRIVATE HEALTH INSURANCE | Admitting: Physical Therapy

## 2017-11-19 ENCOUNTER — Encounter (HOSPITAL_COMMUNITY): Payer: PRIVATE HEALTH INSURANCE

## 2017-11-24 ENCOUNTER — Ambulatory Visit (HOSPITAL_COMMUNITY): Payer: PRIVATE HEALTH INSURANCE

## 2017-11-26 ENCOUNTER — Encounter (HOSPITAL_COMMUNITY): Payer: PRIVATE HEALTH INSURANCE
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
